# Patient Record
Sex: Male | Born: 1935 | Marital: Single
Health system: Southern US, Community
[De-identification: ages and names within clinical notes are randomized; demographics above are authoritative.]

---

## 2012-06-08 ENCOUNTER — Observation Stay: Payer: Self-pay | Admitting: Internal Medicine

## 2012-06-08 LAB — COMPREHENSIVE METABOLIC PANEL
Anion Gap: 10 (ref 7–16)
Calcium, Total: 8.7 mg/dL (ref 8.5–10.1)
Chloride: 106 mmol/L (ref 98–107)
Co2: 22 mmol/L (ref 21–32)
EGFR (African American): >60
EGFR (Non-African Amer.): 58 — ABNORMAL LOW
Osmolality: 280 (ref 275–301)
Potassium: 4 mmol/L (ref 3.5–5.1)
Total Protein: 6.7 g/dL (ref 6.4–8.2)

## 2012-06-08 LAB — CBC
HCT: 36.7 % — ABNORMAL LOW (ref 40.0–52.0)
MCH: 29.8 pg (ref 26.0–34.0)
MCHC: 34.6 g/dL (ref 32.0–36.0)
MCV: 86 fL (ref 80–100)
Platelet: 169 10*3/uL (ref 150–440)
RBC: 4.26 10*6/uL — ABNORMAL LOW (ref 4.40–5.90)
WBC: 7.1 10*3/uL (ref 3.8–10.6)

## 2012-06-08 LAB — CK TOTAL AND CKMB (NOT AT ARMC)
CK, Total: 63 U/L (ref 35–232)
CK-MB: 0.8 ng/mL (ref 0.5–3.6)

## 2012-06-08 LAB — TROPONIN I: Troponin-I: <0.02 ng/mL

## 2012-06-09 DIAGNOSIS — I059 Rheumatic mitral valve disease, unspecified: Secondary | ICD-10-CM

## 2012-06-09 LAB — HEMOGLOBIN A1C: Hemoglobin A1C: 5.1 % (ref 4.2–6.3)

## 2012-06-09 LAB — CBC WITH DIFFERENTIAL/PLATELET
Basophil %: 0.8 %
Eosinophil #: 0.1 10*3/uL (ref 0.0–0.7)
Eosinophil %: 0.8 %
HCT: 34.7 % — ABNORMAL LOW (ref 40.0–52.0)
HGB: 12 g/dL — ABNORMAL LOW (ref 13.0–18.0)
Lymphocyte #: 1.3 10*3/uL (ref 1.0–3.6)
MCHC: 34.5 g/dL (ref 32.0–36.0)
Monocyte %: 6.4 %
Neutrophil #: 5.1 10*3/uL (ref 1.4–6.5)
Neutrophil %: 73.9 %
RBC: 4 10*6/uL — ABNORMAL LOW (ref 4.40–5.90)

## 2012-06-09 LAB — LIPID PANEL
Cholesterol: 179 mg/dL (ref 0–200)
HDL Cholesterol: 35 mg/dL — ABNORMAL LOW (ref 40–60)
Ldl Cholesterol, Calc: 117 mg/dL — ABNORMAL HIGH (ref 0–100)
Triglycerides: 134 mg/dL (ref 0–200)

## 2012-06-09 LAB — BASIC METABOLIC PANEL
Anion Gap: 5 — ABNORMAL LOW (ref 7–16)
Osmolality: 279 (ref 275–301)
Potassium: 4.5 mmol/L (ref 3.5–5.1)

## 2012-06-09 LAB — CK TOTAL AND CKMB (NOT AT ARMC): CK-MB: 1.3 ng/mL (ref 0.5–3.6)

## 2012-06-09 LAB — TROPONIN I: Troponin-I: <0.02 ng/mL

## 2012-06-09 LAB — MAGNESIUM: Magnesium: 2.1 mg/dL

## 2012-11-28 ENCOUNTER — Emergency Department: Payer: Self-pay | Admitting: Emergency Medicine

## 2012-11-28 LAB — CBC
HCT: 36.3 % — ABNORMAL LOW (ref 40.0–52.0)
MCH: 29.9 pg (ref 26.0–34.0)
MCHC: 35.8 g/dL (ref 32.0–36.0)
Platelet: 190 10*3/uL (ref 150–440)
RBC: 4.36 10*6/uL — ABNORMAL LOW (ref 4.40–5.90)
RDW: 14.6 % — ABNORMAL HIGH (ref 11.5–14.5)
WBC: 6.7 10*3/uL (ref 3.8–10.6)

## 2012-11-28 LAB — COMPREHENSIVE METABOLIC PANEL
Albumin: 3.4 g/dL (ref 3.4–5.0)
Alkaline Phosphatase: 97 U/L (ref 50–136)
BUN: 18 mg/dL (ref 7–18)
Bilirubin,Total: 0.8 mg/dL (ref 0.2–1.0)
Co2: 26 mmol/L (ref 21–32)
Creatinine: 1.43 mg/dL — ABNORMAL HIGH (ref 0.60–1.30)
EGFR (Non-African Amer.): 47 — ABNORMAL LOW
Glucose: 122 mg/dL — ABNORMAL HIGH (ref 65–99)
Potassium: 3.2 mmol/L — ABNORMAL LOW (ref 3.5–5.1)
SGOT(AST): 17 U/L (ref 15–37)
Sodium: 140 mmol/L (ref 136–145)

## 2012-11-28 LAB — TSH: Thyroid Stimulating Horm: 1.68 u[IU]/mL

## 2012-11-29 LAB — URINALYSIS, COMPLETE
Blood: NEGATIVE
Ketone: NEGATIVE
Nitrite: NEGATIVE
Ph: 7 (ref 4.5–8.0)
Protein: NEGATIVE
RBC,UR: NONE SEEN /HPF (ref 0–5)
Squamous Epithelial: <1

## 2012-11-29 LAB — DRUG SCREEN, URINE
Barbiturates, Ur Screen: NEGATIVE (ref ?–200)
Benzodiazepine, Ur Scrn: NEGATIVE (ref ?–200)
Cannabinoid 50 Ng, Ur ~~LOC~~: NEGATIVE (ref ?–50)
MDMA (Ecstasy)Ur Screen: NEGATIVE (ref ?–500)
Methadone, Ur Screen: NEGATIVE (ref ?–300)

## 2013-02-04 ENCOUNTER — Emergency Department: Payer: Self-pay | Admitting: Emergency Medicine

## 2013-02-04 LAB — CBC
HGB: 11.9 g/dL — ABNORMAL LOW (ref 13.0–18.0)
MCH: 30.2 pg (ref 26.0–34.0)
MCV: 85 fL (ref 80–100)
RBC: 3.94 10*6/uL — ABNORMAL LOW (ref 4.40–5.90)
RDW: 14.7 % — ABNORMAL HIGH (ref 11.5–14.5)
WBC: 7.3 10*3/uL (ref 3.8–10.6)

## 2013-02-04 LAB — COMPREHENSIVE METABOLIC PANEL
Albumin: 3.5 g/dL (ref 3.4–5.0)
Alkaline Phosphatase: 92 U/L (ref 50–136)
Anion Gap: 9 (ref 7–16)
BUN: 26 mg/dL — ABNORMAL HIGH (ref 7–18)
Bilirubin,Total: 0.6 mg/dL (ref 0.2–1.0)
Calcium, Total: 8.6 mg/dL (ref 8.5–10.1)
Chloride: 106 mmol/L (ref 98–107)
Co2: 25 mmol/L (ref 21–32)
EGFR (Non-African Amer.): 47 — ABNORMAL LOW
Glucose: 110 mg/dL — ABNORMAL HIGH (ref 65–99)
Osmolality: 285 (ref 275–301)
Potassium: 4 mmol/L (ref 3.5–5.1)
SGOT(AST): 22 U/L (ref 15–37)
SGPT (ALT): 20 U/L (ref 12–78)
Sodium: 140 mmol/L (ref 136–145)
Total Protein: 6.7 g/dL (ref 6.4–8.2)

## 2013-02-04 LAB — URINALYSIS, COMPLETE
Bilirubin,UR: NEGATIVE
Blood: NEGATIVE
Leukocyte Esterase: NEGATIVE
Ph: 6 (ref 4.5–8.0)
Protein: NEGATIVE
WBC UR: NONE SEEN /HPF (ref 0–5)

## 2013-02-04 LAB — DRUG SCREEN, URINE
Amphetamines, Ur Screen: NEGATIVE (ref ?–1000)
Cannabinoid 50 Ng, Ur ~~LOC~~: NEGATIVE (ref ?–50)
MDMA (Ecstasy)Ur Screen: NEGATIVE (ref ?–500)
Methadone, Ur Screen: NEGATIVE (ref ?–300)
Opiate, Ur Screen: NEGATIVE (ref ?–300)
Phencyclidine (PCP) Ur S: NEGATIVE (ref ?–25)
Tricyclic, Ur Screen: NEGATIVE (ref ?–1000)

## 2013-02-04 LAB — SALICYLATE LEVEL: Salicylates, Serum: <1.7 mg/dL

## 2013-02-04 LAB — ETHANOL: Ethanol %: <0.003 % (ref 0.000–0.080)

## 2013-02-04 LAB — TSH: Thyroid Stimulating Horm: 2.56 u[IU]/mL

## 2013-04-15 ENCOUNTER — Emergency Department: Payer: Self-pay | Admitting: Emergency Medicine

## 2013-04-15 LAB — URINALYSIS, COMPLETE
Bacteria: NONE SEEN
Blood: NEGATIVE
Glucose,UR: NEGATIVE mg/dL (ref 0–75)
Ketone: NEGATIVE
Leukocyte Esterase: NEGATIVE
Nitrite: NEGATIVE
Protein: NEGATIVE
RBC,UR: 1 /HPF (ref 0–5)
Squamous Epithelial: <1

## 2013-04-15 LAB — CBC WITH DIFFERENTIAL/PLATELET
Basophil #: 0.1 10*3/uL (ref 0.0–0.1)
Basophil %: 0.9 %
Eosinophil #: 0.1 10*3/uL (ref 0.0–0.7)
HGB: 12.4 g/dL — ABNORMAL LOW (ref 13.0–18.0)
Lymphocyte %: 25.3 %
MCH: 29.3 pg (ref 26.0–34.0)
Neutrophil %: 62.3 %
Platelet: 187 10*3/uL (ref 150–440)
RBC: 4.24 10*6/uL — ABNORMAL LOW (ref 4.40–5.90)
RDW: 14.2 % (ref 11.5–14.5)

## 2013-04-15 LAB — COMPREHENSIVE METABOLIC PANEL
Alkaline Phosphatase: 78 U/L
Anion Gap: 6 — ABNORMAL LOW (ref 7–16)
Chloride: 105 mmol/L (ref 98–107)
Creatinine: 1.42 mg/dL — ABNORMAL HIGH (ref 0.60–1.30)
EGFR (African American): 54 — ABNORMAL LOW
EGFR (Non-African Amer.): 47 — ABNORMAL LOW
Glucose: 89 mg/dL (ref 65–99)
Osmolality: 282 (ref 275–301)
Potassium: 4 mmol/L (ref 3.5–5.1)
SGOT(AST): 25 U/L (ref 15–37)
SGPT (ALT): 22 U/L (ref 12–78)
Sodium: 139 mmol/L (ref 136–145)

## 2013-04-15 LAB — CK TOTAL AND CKMB (NOT AT ARMC): CK-MB: 3.3 ng/mL (ref 0.5–3.6)

## 2013-08-13 ENCOUNTER — Emergency Department: Payer: Self-pay | Admitting: Emergency Medicine

## 2013-08-13 LAB — COMPREHENSIVE METABOLIC PANEL
ALBUMIN: 3.4 g/dL (ref 3.4–5.0)
ANION GAP: 4 — AB (ref 7–16)
Alkaline Phosphatase: 81 U/L
BILIRUBIN TOTAL: 0.6 mg/dL (ref 0.2–1.0)
BUN: 22 mg/dL — ABNORMAL HIGH (ref 7–18)
CHLORIDE: 108 mmol/L — AB (ref 98–107)
CREATININE: 1.24 mg/dL (ref 0.60–1.30)
Calcium, Total: 8.9 mg/dL (ref 8.5–10.1)
Co2: 30 mmol/L (ref 21–32)
EGFR (African American): >60
EGFR (Non-African Amer.): 55 — ABNORMAL LOW
Glucose: 90 mg/dL (ref 65–99)
Osmolality: 286 (ref 275–301)
Potassium: 4.2 mmol/L (ref 3.5–5.1)
SGOT(AST): 14 U/L — ABNORMAL LOW (ref 15–37)
SGPT (ALT): 17 U/L (ref 12–78)
Sodium: 142 mmol/L (ref 136–145)
Total Protein: 6.6 g/dL (ref 6.4–8.2)

## 2013-08-13 LAB — CBC
HCT: 36.4 % — ABNORMAL LOW (ref 40.0–52.0)
HGB: 11.7 g/dL — AB (ref 13.0–18.0)
MCH: 28.2 pg (ref 26.0–34.0)
MCHC: 32.3 g/dL (ref 32.0–36.0)
MCV: 87 fL (ref 80–100)
Platelet: 157 10*3/uL (ref 150–440)
RBC: 4.17 10*6/uL — ABNORMAL LOW (ref 4.40–5.90)
RDW: 14.4 % (ref 11.5–14.5)
WBC: 8.5 10*3/uL (ref 3.8–10.6)

## 2013-08-13 LAB — TROPONIN I

## 2014-08-24 NOTE — H&P (Signed)
PATIENT NAME:  Philip Carey, Philip Carey MR#:  [id number] DATE OF BIRTH:  1933-04-09  DATE OF ADMISSION:  06/08/2012  REFERRING PHYSICIAN:  Su Leyobert L. Kinner, MD  PRIMARY CARE PHYSICIAN:  Not local.   REASON FOR ADMISSION:  Near syncope.   HISTORY OF PRESENT ILLNESS:  The patient is a pleasant 81 year old Caucasian male with what appears to be at least moderate dementia and hypertension who is here for the above chief complaint. The patient is a rather poor historian. The patient currently lives with his stepson as his wife passed away several weeks ago. The patient per stepson has had increased memory issues in the last couple of weeks. The patient was in his usual state of selfness until this morning. Apparently, he went to the bathroom, had a bowel movement, passed some urine, then he started to have some dizziness, was not himself and had some diaphoresis. He does not report any chest pains at the time, but he appears to downplay most things and keeps saying that he is 81 and he is healthy. He has not been hospitalized for decades and overall he is in good health. The stepson corroborates that he overall is in good health. Of note, the family had some concerns with the patient's outpatient medications. He is on 10 mg of terazosin which was just resumed within the last week. He does not have any shortness of breath, but was noted to have significant orthostatic hypotension on admission with the systolic blood pressure dropping close to 40 points with standing. He received a liter of fluid and the hospitalist service was contacted for further evaluation and management.   PAST MEDICAL HISTORY:  Unable to fully get as the patient appears to be unreliable, but hypertension, prostatitis, dementia, hyperlipidemia.   PAST SURGICAL HISTORY:  He denies.   SOCIAL HISTORY:  Currently living with stepson who is the healthcare power of attorney per him. No alcohol, drugs or tobacco. He used to work for a cigarette company  for many years.   ALLERGIES:  No known drug allergies.   MEDICATIONS:  Aspirin 81 mg daily, diltiazem 240 mg extended-release 1 tab 2 times a day, donepezil 10 mg daily, gemfibrozil 600 mg 2 times a day, Lexapro 10 mg once a day, lorazepam 0.5 mg at bedtime, Namenda 10 mg 2 times a day, terazosin 10 mg at bedtime, vitamin D 3000 International Units once a day.   FAMILY HISTORY:  Denies.   REVIEW OF SYSTEMS: CONSTITUTIONAL: No fever, fatigue or weakness.  EYES: No blurry vision or double vision.  ENT: No tinnitus, hearing loss.  RESPIRATORY: Denies cough, wheezing or shortness of breath.  CARDIOVASCULAR: Denies chest pain. Had some dizziness earlier in the morning, resolved now. No history of arrhythmia.  GASTROINTESTINAL: No nausea, vomiting, diarrhea, abdominal pain. No dark stools.  GENITOURINARY: Has polyuria and per grandson constantly uses the bathroom as he constantly passes urine.  ENDOCRINE: No nocturia or thyroid problems.  HEMATOLOGIC/LYMPHATICS: No anemia or easy bruising.  SKIN: No new rashes.  MUSCULOSKELETAL: Denies arthritis or gout.  PSYCHIATRIC: Has some insomnia and forgetfulness.   PHYSICAL EXAMINATION: VITAL SIGNS: Temperature on arrival 97.4, pulse 64, respiratory rate 18, blood pressure was 154/70, oxygen sat was 97% on room air. While lying, the blood pressure was 127/55 and while standing it dropped to 81/53.  GENERAL: The patient is a well-developed Caucasian male sitting in bed in no obvious distress, talking in full sentences.  HEENT: Normocephalic, atraumatic. Pupils are equal and reactive. Extraocular muscles  intact. Moist mucous membranes.  NECK: Supple. No thyroid tenderness or cervical lymphadenopathy.  CARDIOVASCULAR: S1 and S2, regularly regular. No murmurs, rubs, or gallops.  LUNGS: Clear to auscultation.  ABDOMEN: Soft, nontender, and nondistended. Hyperactive bowel sounds.  EXTREMITIES: No significant lower extremity edema.  NEUROLOGIC: Cranial  nerves II through XII grossly intact. Strength is 5 out of 5 in all extremities. Sensation is intact to light touch.  PSYCHIATRIC: Awake, alert, oriented x 2 and cooperative.   DIAGNOSTIC DATA:  Glucose 121, BUN 21, creatinine 1.2, sodium 138, potassium 4. LFTs are within normal limits. Troponin is negative. WBC is 7.1, hemoglobin 12.7, platelets 169. EKG: Sinus with rate of 67 with first-degree AV block, PR interval was 262, prolonged QT, nonspecific ST and T-wave changes, no acute ST elevations or significant depressions. X-ray of the chest: No acute cardiopulmonary disease.   ASSESSMENT AND PLAN:  We have a pleasant 81 year old Caucasian male with hypertension, at least moderate dementia, prostatitis, history of hyperlipidemia admitted for presyncope. The patient did not have any loss of consciousness nor has this happened before. He appears to be significantly orthostatic and his EKG is abnormal with first-degree AV block and prolonged QT. We are going to admit the patient for observation although he did not have any significant chest pains, was ruled out for acute coronary syndrome, given the abnormal EKG, but we would also hold the diltiazem as it is a rather large dose and there is no history of atrial fibrillation at least that I could get from the family. He appears to have also severely orthostatic hypotension with a systolic blood drop of at least greater than 30 points. We will also stop the terazosin as it could potentially also have caused the orthostatic hypotension. We will start him on some intravenous fluids, again hold the terazosin and diltiazem and for blood pressure start the patient on amlodipine. We will check ultrasound of the carotids and echocardiogram to rule out a carotid stenosis and significant valvular abnormalities. We will also look for any structural heart issues with the echocardiogram. We will place him on telemetry to look for any significant arrhythmias. We will cycle  cardiac markers and resume the aspirin. In regards to his dementia, we will continue his outpatient Namenda and Aricept; however, hold the lorazepam as he is using this for a sleep aid mostly. We will start him on trazodone and see if that works and if not, we can bump up the trazodone dose or alternatively use low-dose Ambien. I will continue the gemfibrozil.   The patient is DO NOT RESUSCITATE per the stepson. The stepson is Evans Lance, [phone].    TOTAL TIME SPENT:  45 minutes.    ____________________________ Krystal Eaton, MD sa:si D: 06/08/2012 15:13:00 ET T: 06/08/2012 15:55:16 ET JOB#: [id number]  cc: Krystal Eaton, MD, <Dictator> Krystal Eaton MD ELECTRONICALLY SIGNED 06/20/2012 15:42

## 2014-08-24 NOTE — Discharge Summary (Signed)
PATIENT NAME:  Philip Carey, Philip Carey MR#:  [id number] DATE OF BIRTH:  1932-08-04  DATE OF ADMISSION:  06/08/2012 DATE OF DISCHARGE:  06/09/2012  PRIMARY CARE PHYSICIAN: [facility] Wall[facility] in [place].   DISCHARGE DIAGNOSES:  1. Orthostatic presyncope.  2. Uncontrolled hypertension.  3. Systolic dysfunction with no signs of congestive heart failure.  4. Dementia.   IMAGING STUDIES DONE: Include bilateral carotid Dopplers which showed no significant carotid stenosis.   Chest x-ray, PA and lateral, which showed no acute cardiopulmonary disease.   Echocardiogram showed ejection fraction of 45% to 50%, with mild global hypokinesis of the left ventricle. Right ventricular systolic pressure normal.   ADMITTING HISTORY AND PHYSICAL: Please see detailed H and P dictated previously. In brief, a 81 year old gentleman with dementia, hypertension, prostate problems, presented to the Emergency Room complaining of dizziness. The patient was found to have significant drop in his blood pressure of 40 mm of systolic on standing up in the Emergency Room. Was admitted to the hospitalist service for further workup and treatment.   HOSPITAL COURSE:  1. Orthostatic presyncope. The patient was on terazosin and Cardizem CD of 480 mg a day. These were thought to be the cause for his orthostatic presyncope and the blood pressure drop. His Cardizem dose was decreased from 480 to 120 mg once a day, also secondary to him having some mild bradycardia in the 50s. His terazosin has been stopped, and the patient has been started on amlodipine secondary to elevated blood pressure in the 160s. I have also explained and advised to the patient to take time to stand up from lying down or sitting position to get acclimatized to the blood pressure.  2. The patient did have decreased ejection fraction of 45% to 50% without any signs of congestive heart failure. Has systolic dysfunction without CHF. No beta blocker started secondary  to baseline bradycardia.   Prior to discharge, the patient's blood pressure is 152/82, saturating 98% on room air, pulse of 62 and discharged home in a fair condition to follow up with [facility] primary care physician at [facility].   DISCHARGE MEDICATIONS: Include:  1. Gemfibrozil 600 mg oral 2 times a day.  2. Lexapro 10 mg oral once a day.  3. Vitamin D3 1000 international units oral once a day.  4. Aspirin 81 mg oral once a day.  5. Donepezil 10 mg oral once a day.  6. Namenda 10 mg oral 2 times a day.  7. Lorazepam 0.5 mg oral once a day at bedtime as needed for insomnia.  8. Cardizem CD 120 mg oral once a day.  9. Amlodipine 10 mg oral once a day.   DISCHARGE INSTRUCTIONS: The patient will be on a low-salt diet. Activity as tolerated. Follow up with primary care physician in 1 to 2 weeks.   TIME SPENT: Time spent on day of discharge in discharge activity was 40 minutes.    ____________________________ Molinda BailiffSrikar R. Vicie Cech, MD srs:gb D: 06/09/2012 14:38:38 ET T: 06/09/2012 22:42:43 ET JOB#: [id number]  cc: Wardell HeathSrikar R. Janne Faulk, MD, <Dictator> Dr. Wallene Huharew, [facility], [place]  Orie FishermanSRIKAR R Demesha Boorman MD ELECTRONICALLY SIGNED 06/17/2012 1:01

## 2015-01-12 IMAGING — CT CT MAXILLOFACIAL WITHOUT CONTRAST
4 of 5 series · 17 of 47 positions shown, 19 images · non-contrast
Comparison: CT scan of April 15, 2013.

CLINICAL DATA: Altered mental status, facial injury after fall.

EXAM:
CT HEAD WITHOUT CONTRAST
CT MAXILLOFACIAL WITHOUT CONTRAST
CT CERVICAL SPINE WITHOUT CONTRAST
TECHNIQUE: Multidetector CT imaging of the head, cervical spine, and
maxillofacial structures were performed using the standard protocol
without intravenous contrast. Multiplanar CT image reconstructions
of the cervical spine and maxillofacial structures were also
generated.

[Series 4: head wo · axial · 0.43mm/px · z∈[+34,+153]mm · 6 of 36 slices shown, 8 images]
[im 6/36  brain]
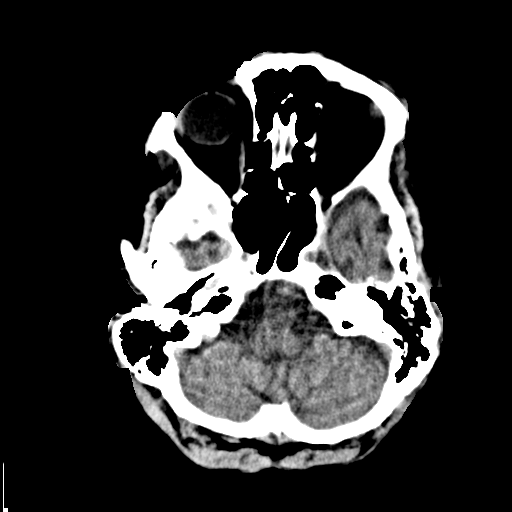
[im 6/36  bone]
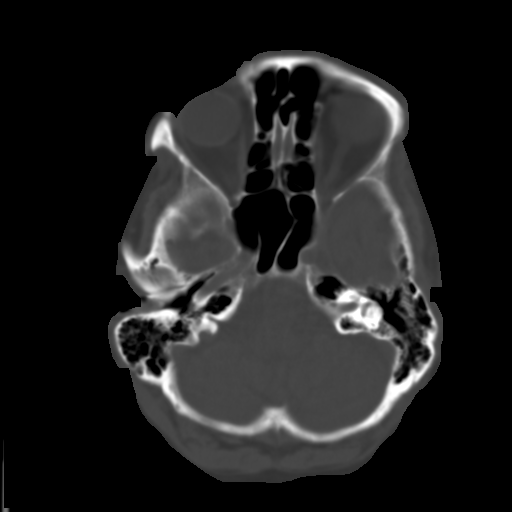
[im 11/36  bone]
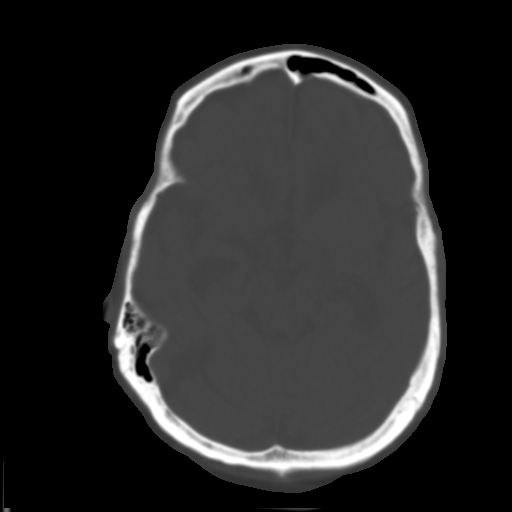
[im 16/36  bone]
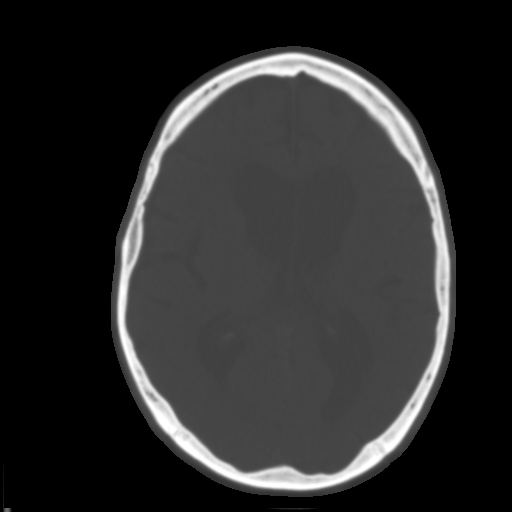
[im 21/36  bone]
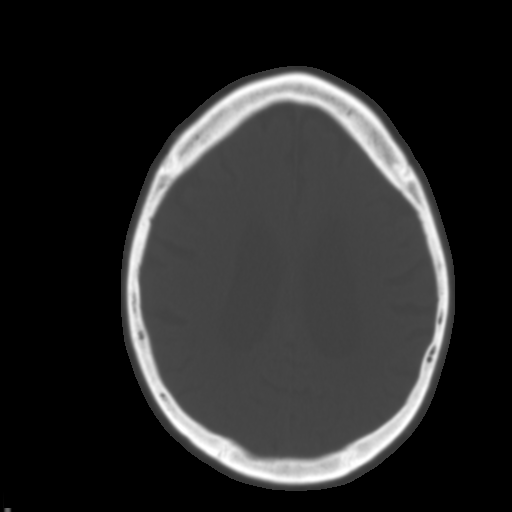
[im 26/36  brain]
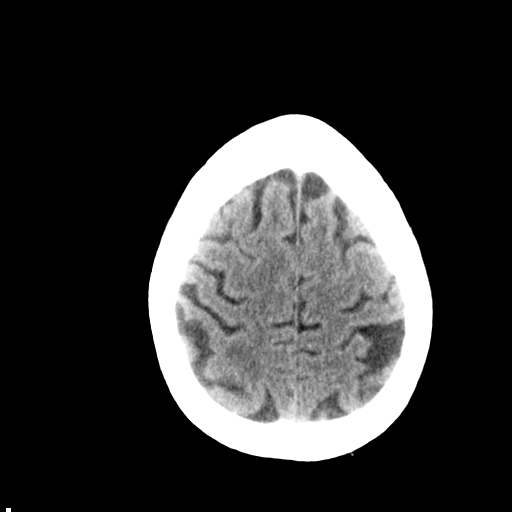
[im 26/36  bone]
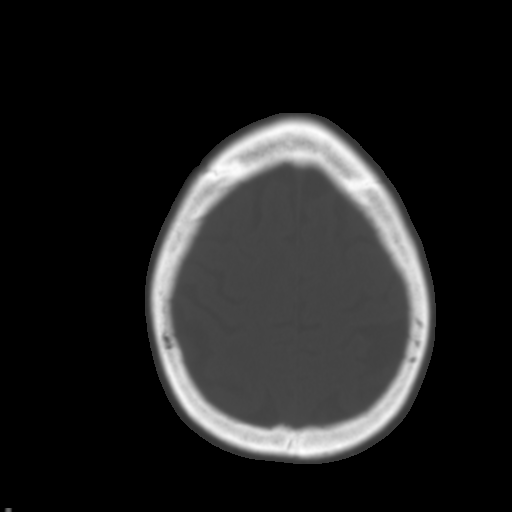
[im 31/36  bone]
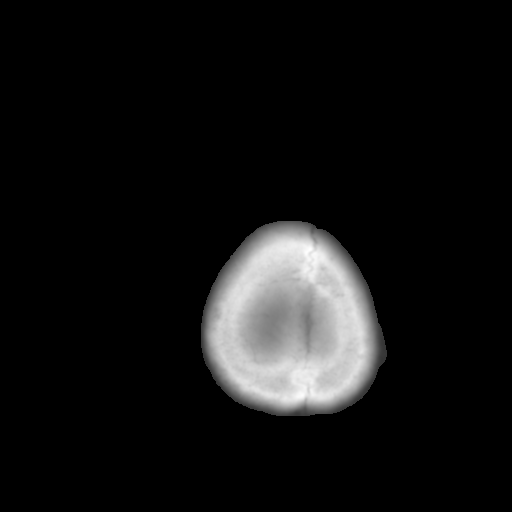

[Series 5: max soft · axial · 0.38mm/px · z∈[-93,-11]mm · 5 of 92 slices shown]
[im 10/92  brain]
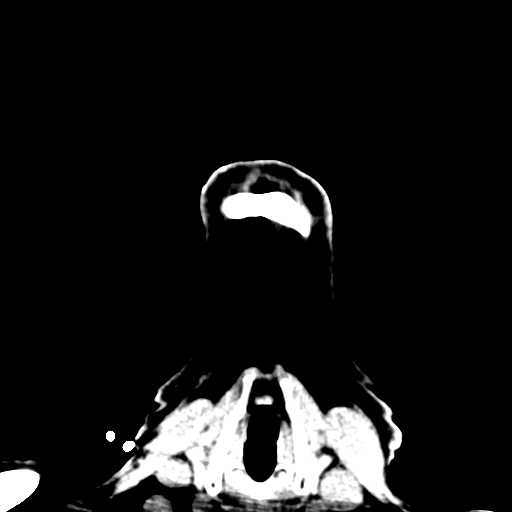
[im 19/92  brain]
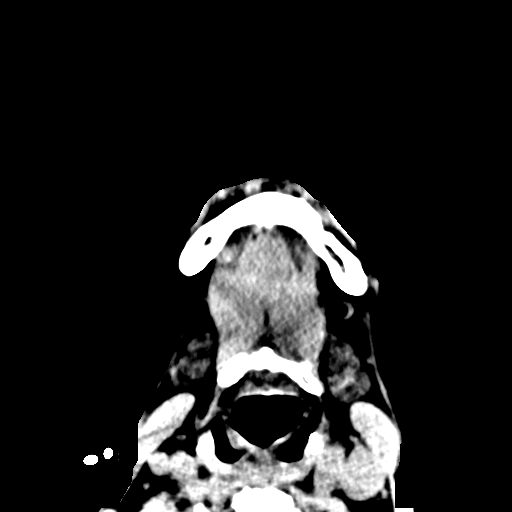
[im 28/92  brain]
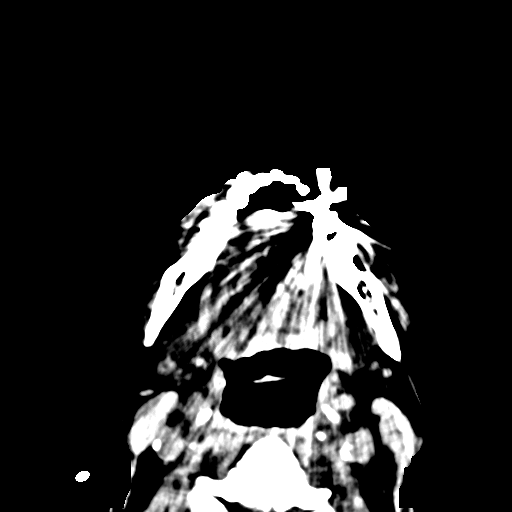
[im 41/92  brain]
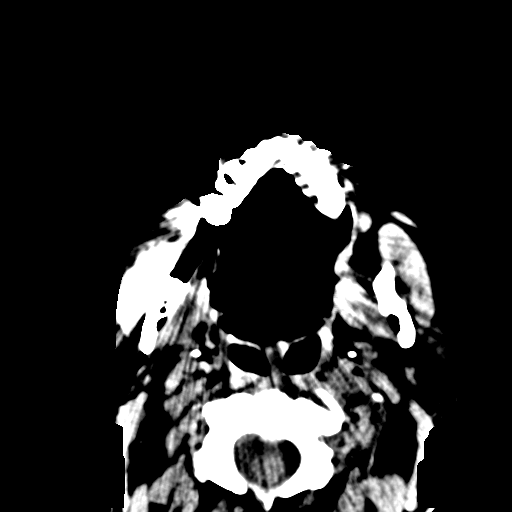
[im 51/92  brain]
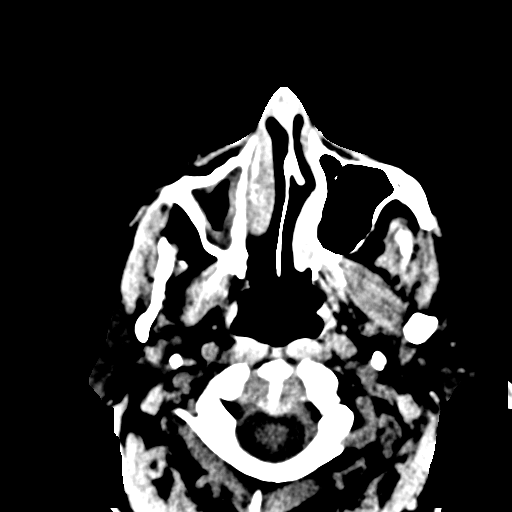

[Series 8: coronal soft · coronal · 0.42mm/px · 3 of 85 slices shown]
[im 29/85  bone]
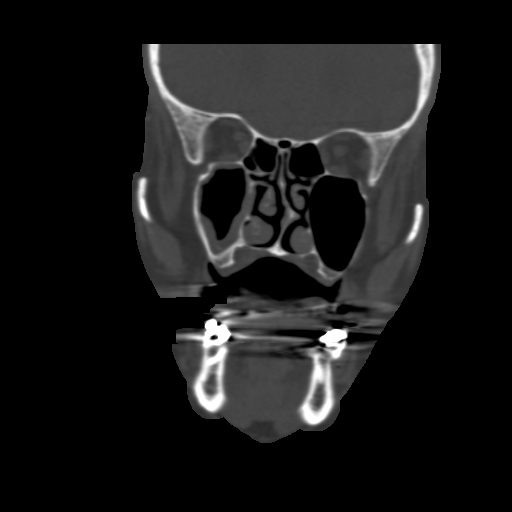
[im 38/85  bone]
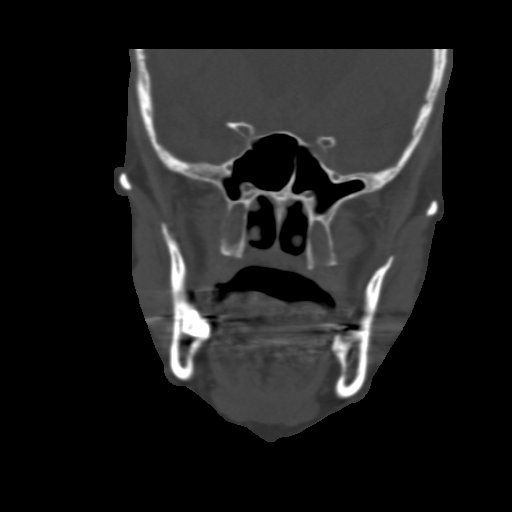
[im 47/85  bone]
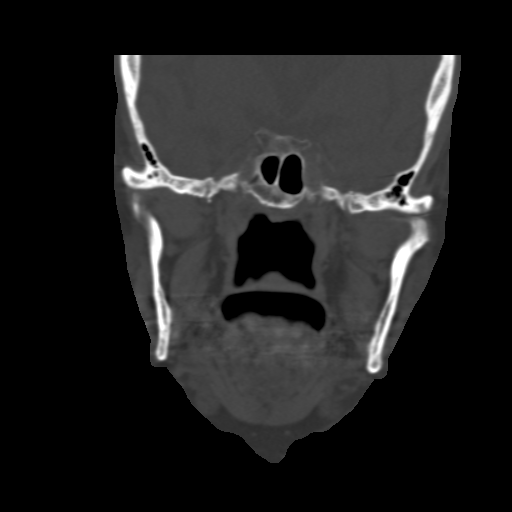

[Series 9: sagittal soft · sagittal · 0.41mm/px · 3 of 96 slices shown]
[im 32/96  bone]
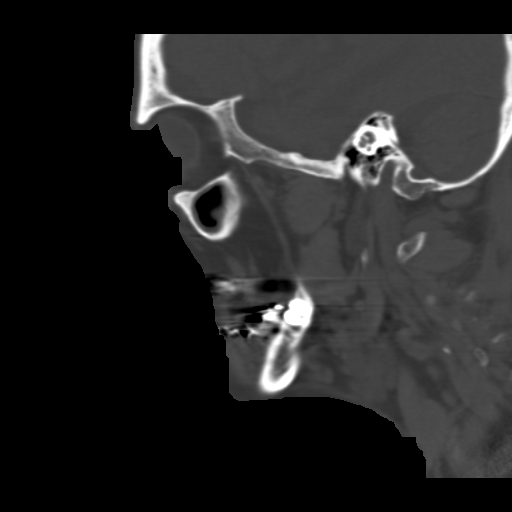
[im 48/96  bone]
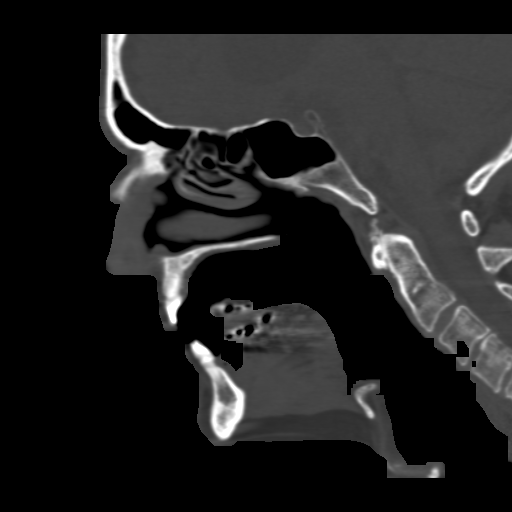
[im 64/96  bone]
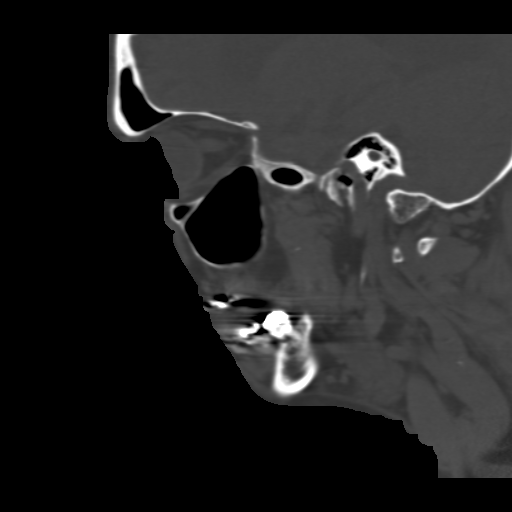

[17 of 47 positions shown; findings below may reference images not displayed]

FINDINGS: CT HEAD FINDINGS

Bony calvarium appears intact. Mild diffuse cortical atrophy is
noted. Mild chronic ischemic white matter disease is noted. No mass
effect or midline shift is noted. Ventricular size is within normal
limits. There is no evidence of mass lesion, hemorrhage or acute
infarction.

CT MAXILLOFACIAL FINDINGS

Mild mucosal thickening of right maxillary sinus is noted. No
fracture or other bony abnormality is noted. Remaining paranasal
sinuses appear normal. Globes and orbits appear normal.

CT CERVICAL SPINE FINDINGS

No fracture or spondylolisthesis is noted. Severe degenerative disc
disease is noted at C5-6 and C6-7 with anterior osteophyte
formation. Degenerative changes is seen involving the posterior
facet joints of the upper cervical spine.
IMPRESSION: Mild diffuse cortical atrophy. Mild chronic ischemic white matter
disease. No acute intracranial abnormality seen.

Mild mucosal thickening of right maxillary sinus suggesting chronic
sinusitis. No acute traumatic injury seen in the maxillofacial
region.

Multilevel degenerative disc disease. No acute abnormality seen in
the cervical spine.

## 2018-05-04 DEATH — deceased
